# Patient Record
Sex: Female | Born: 1980 | Hispanic: No | Marital: Single | Smoking: Current every day smoker
Health system: Southern US, Community
[De-identification: ages and names within clinical notes are randomized; demographics above are authoritative.]

---

## 2019-12-13 ENCOUNTER — Emergency Department (HOSPITAL_BASED_OUTPATIENT_CLINIC_OR_DEPARTMENT_OTHER): Payer: Self-pay

## 2019-12-13 ENCOUNTER — Other Ambulatory Visit: Payer: Self-pay

## 2019-12-13 ENCOUNTER — Encounter (HOSPITAL_BASED_OUTPATIENT_CLINIC_OR_DEPARTMENT_OTHER): Payer: Self-pay | Admitting: Emergency Medicine

## 2019-12-13 ENCOUNTER — Emergency Department (HOSPITAL_BASED_OUTPATIENT_CLINIC_OR_DEPARTMENT_OTHER)
Admission: EM | Admit: 2019-12-13 | Discharge: 2019-12-13 | Disposition: A | Discharge status: Home / Self Care | Payer: Self-pay | Attending: Emergency Medicine | Admitting: Emergency Medicine

## 2019-12-13 DIAGNOSIS — F17210 Nicotine dependence, cigarettes, uncomplicated: Secondary | ICD-10-CM | POA: Insufficient documentation

## 2019-12-13 DIAGNOSIS — M25512 Pain in left shoulder: Secondary | ICD-10-CM | POA: Insufficient documentation

## 2019-12-13 DIAGNOSIS — M62838 Other muscle spasm: Secondary | ICD-10-CM | POA: Insufficient documentation

## 2019-12-13 MED ORDER — KETOROLAC TROMETHAMINE 15 MG/ML IJ SOLN
15.0000 mg | Freq: Once | INTRAMUSCULAR | Status: AC
  Administered 2019-12-13: 15 mg via INTRAMUSCULAR
  Filled 2019-12-13: qty 1

## 2019-12-13 MED ORDER — CYCLOBENZAPRINE HCL 5 MG PO TABS
5.0000 mg | ORAL_TABLET | Freq: Once | ORAL | Status: AC
  Administered 2019-12-13: 5 mg via ORAL
  Filled 2019-12-13: qty 1

## 2019-12-13 MED ORDER — IBUPROFEN 600 MG PO TABS: 600.0000 mg | ORAL_TABLET | Freq: Four times a day (QID) | ORAL | 0 refills | Status: AC | PRN

## 2019-12-13 MED ORDER — CYCLOBENZAPRINE HCL 10 MG PO TABS: 10.0000 mg | ORAL_TABLET | Freq: Two times a day (BID) | ORAL | 0 refills | Status: AC | PRN
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
